# Patient Record
Sex: Male | Born: 1985
Health system: Southern US, Community
[De-identification: ages and names within clinical notes are randomized; demographics above are authoritative.]

## PROBLEM LIST (undated history)

## (undated) DIAGNOSIS — F419 Anxiety disorder, unspecified: Secondary | ICD-10-CM

## (undated) HISTORY — PX: ANTERIOR CRUCIATE LIGAMENT REPAIR: SHX115

---
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
## 2005-03-24 ENCOUNTER — Emergency Department (HOSPITAL_COMMUNITY): Admission: EM | Admit: 2005-03-24 | Discharge: 2005-03-24 | Payer: Self-pay | Admitting: Emergency Medicine

## 2007-06-17 ENCOUNTER — Emergency Department (HOSPITAL_COMMUNITY): Admission: EM | Admit: 2007-06-17 | Discharge: 2007-06-17 | Payer: Self-pay | Admitting: Emergency Medicine

## 2013-06-29 ENCOUNTER — Other Ambulatory Visit: Payer: Self-pay | Admitting: Orthopaedic Surgery

## 2013-06-29 DIAGNOSIS — M25562 Pain in left knee: Secondary | ICD-10-CM

## 2013-07-01 ENCOUNTER — Ambulatory Visit
Admission: RE | Admit: 2013-07-01 | Discharge: 2013-07-01 | Disposition: A | Discharge status: Home / Self Care | Payer: Federal, State, Local not specified - PPO | Source: Ambulatory Visit | Attending: Orthopaedic Surgery | Admitting: Orthopaedic Surgery

## 2013-07-01 DIAGNOSIS — M25562 Pain in left knee: Secondary | ICD-10-CM

## 2013-07-05 ENCOUNTER — Other Ambulatory Visit: Payer: Self-pay

## 2014-01-26 ENCOUNTER — Emergency Department (HOSPITAL_COMMUNITY): Payer: Federal, State, Local not specified - PPO

## 2014-01-26 ENCOUNTER — Emergency Department (HOSPITAL_COMMUNITY)
Admission: EM | Admit: 2014-01-26 | Discharge: 2014-01-26 | Disposition: A | Discharge status: Home / Self Care | Payer: Federal, State, Local not specified - PPO | Attending: Emergency Medicine | Admitting: Emergency Medicine

## 2014-01-26 ENCOUNTER — Encounter (HOSPITAL_COMMUNITY): Payer: Self-pay | Admitting: Emergency Medicine

## 2014-01-26 DIAGNOSIS — R1031 Right lower quadrant pain: Secondary | ICD-10-CM | POA: Insufficient documentation

## 2014-01-26 DIAGNOSIS — R110 Nausea: Secondary | ICD-10-CM | POA: Insufficient documentation

## 2014-01-26 DIAGNOSIS — R1033 Periumbilical pain: Secondary | ICD-10-CM | POA: Insufficient documentation

## 2014-01-26 LAB — CBC WITH DIFFERENTIAL/PLATELET
BASOS ABS: 0 10*3/uL (ref 0.0–0.1)
Basophils Relative: 0 % (ref 0–1)
EOS ABS: 0.4 10*3/uL (ref 0.0–0.7)
Eosinophils Relative: 4 % (ref 0–5)
HCT: 41.1 % (ref 39.0–52.0)
Hemoglobin: 14.2 g/dL (ref 13.0–17.0)
Lymphocytes Relative: 24 % (ref 12–46)
Lymphs Abs: 1.9 10*3/uL (ref 0.7–4.0)
MCH: 31.3 pg (ref 26.0–34.0)
MCHC: 34.5 g/dL (ref 30.0–36.0)
MCV: 90.5 fL (ref 78.0–100.0)
Monocytes Absolute: 0.5 10*3/uL (ref 0.1–1.0)
Monocytes Relative: 6 % (ref 3–12)
Neutro Abs: 5.2 10*3/uL (ref 1.7–7.7)
Neutrophils Relative %: 66 % (ref 43–77)
PLATELETS: 233 10*3/uL (ref 150–400)
RBC: 4.54 MIL/uL (ref 4.22–5.81)
RDW: 12.3 % (ref 11.5–15.5)
WBC: 8 10*3/uL (ref 4.0–10.5)

## 2014-01-26 LAB — COMPREHENSIVE METABOLIC PANEL
ALBUMIN: 4.3 g/dL (ref 3.5–5.2)
ALK PHOS: 54 U/L (ref 39–117)
ALT: 22 U/L (ref 0–53)
AST: 23 U/L (ref 0–37)
BUN: 12 mg/dL (ref 6–23)
CO2: 28 mEq/L (ref 19–32)
Calcium: 9.5 mg/dL (ref 8.4–10.5)
Chloride: 101 mEq/L (ref 96–112)
Creatinine, Ser: 1.04 mg/dL (ref 0.50–1.35)
GFR calc Af Amer: >90 mL/min (ref >90)
GFR calc non Af Amer: >90 mL/min (ref >90)
Glucose, Bld: 90 mg/dL (ref 70–99)
POTASSIUM: 4.3 meq/L (ref 3.7–5.3)
SODIUM: 141 meq/L (ref 137–147)
TOTAL PROTEIN: 7.4 g/dL (ref 6.0–8.3)
Total Bilirubin: 1.2 mg/dL (ref 0.3–1.2)

## 2014-01-26 LAB — LIPASE, BLOOD: Lipase: 30 U/L (ref 11–59)

## 2014-01-26 MED ORDER — MORPHINE SULFATE 4 MG/ML IJ SOLN: 4.0000 mg | Freq: Once | INTRAMUSCULAR | Status: DC

## 2014-01-26 MED ORDER — SODIUM CHLORIDE 0.9 % IV BOLUS (SEPSIS)
1000.0000 mL | Freq: Once | INTRAVENOUS | Status: AC
  Administered 2014-01-26: 1000 mL via INTRAVENOUS

## 2014-01-26 MED ORDER — ONDANSETRON 4 MG PO TBDP: ORAL_TABLET | ORAL | Status: DC

## 2014-01-26 MED ORDER — KETOROLAC TROMETHAMINE 30 MG/ML IJ SOLN
30.0000 mg | Freq: Once | INTRAMUSCULAR | Status: AC
  Administered 2014-01-26: 30 mg via INTRAVENOUS

## 2014-01-26 MED ORDER — MORPHINE SULFATE 4 MG/ML IJ SOLN
4.0000 mg | Freq: Once | INTRAMUSCULAR | Status: AC
  Administered 2014-01-26: 4 mg via INTRAVENOUS

## 2014-01-26 MED ORDER — IOHEXOL 300 MG/ML  SOLN
100.0000 mL | Freq: Once | INTRAMUSCULAR | Status: AC | PRN
  Administered 2014-01-26: 100 mL via INTRAVENOUS

## 2014-01-26 NOTE — ED Notes (Signed)
Pt ambulating independently w/ steady gait on d/c in no acute distress, A&Ox4. D/c instructions reviewed w/ pt and family - pt and family deny any further questions or concerns at present. Rx given x1  

## 2014-01-26 NOTE — ED Notes (Signed)
ABdominal  Pain located rt. Side near the  Umbilicus Began yesterday. Pt. Having nausea. Denies any vomiting or fevers

## 2014-01-26 NOTE — ED Provider Notes (Signed)
CSN: [id number]     Arrival date & time 01/26/14  1700 History   First MD Initiated Contact with Patient 01/26/14 1711     Chief Complaint  Patient presents with  . Abdominal Pain     (Consider location/radiation/quality/duration/timing/severity/associated sxs/prior Treatment) Patient is a 28 y.o. male presenting with abdominal pain. The history is provided by the patient.  Abdominal Pain Pain location:  Periumbilical and RLQ Pain radiates to:  Does not radiate Pain severity:  Moderate Onset quality:  Gradual Timing:  Constant Progression:  Unchanged Chronicity:  New Relieved by:  Nothing Worsened by:  Movement and palpation Ineffective treatments:  None tried Associated symptoms: nausea   Associated symptoms: no chest pain, no chills, no constipation, no cough, no diarrhea, no dysuria, no fever, no shortness of breath and no vomiting     28 yo male pw RLQ abd pain. Onset noon yesterday. Nausea. No emesis. Non-radiating. Not had previously. Normal BM today. Normal uop. No trauma. Ate fast food day before symptoms started.  Went to urgent care today. Had blood work that had finding concerning for appendicitis (presumed leukocytosis) and urinalysis concerning for dehydration (high sp gravity, no blood). Sent to outpt imaging for CT scan. Arrived after hours. Directed to ED for further mgmt.  No CP or SOB.      History reviewed. No pertinent past medical history. History reviewed. No pertinent past surgical history. No family history on file. History  Substance Use Topics  . Smoking status: Never Smoker   . Smokeless tobacco: Not on file  . Alcohol Use: Yes     Comment: occassiona    Review of Systems  Constitutional: Negative for fever and chills.  HENT: Negative for congestion and rhinorrhea.   Respiratory: Negative for cough and shortness of breath.   Cardiovascular: Negative for chest pain.  Gastrointestinal: Positive for nausea and abdominal pain. Negative for  vomiting, diarrhea and constipation.  Genitourinary: Negative for dysuria, flank pain, difficulty urinating, penile pain and testicular pain (although states he had some mild radiation to groin yesterday, but states it was minimal and none currently.).  Neurological: Negative for headaches.  All other systems reviewed and are negative.     Allergies  Review of patient's allergies indicates no known allergies.  Home Medications   Prior to Admission medications   Medication Sig Start Date End Date Taking? Authorizing Provider  ondansetron (ZOFRAN ODT) 4 MG disintegrating tablet 4mg  ODT q6 hours prn nausea/vomit 01/26/14   Stevie Kernyan Ugochi Henzler, MD   BP 129/60  Pulse 82  Temp(Src) 97.8 F (36.6 C) (Oral)  Resp 18  Ht 6\' 3"  (1.905 m)  Wt 197 lb (89.359 kg)  BMI 24.62 kg/m2  SpO2 99% Physical Exam  Nursing note and vitals reviewed. Constitutional: He is oriented to person, place, and time. He appears well-developed and well-nourished. No distress.  Ambulatory. Sitting up in bed. Speaks in full sentences.  HENT:  Head: Normocephalic and atraumatic.  Eyes: Conjunctivae are normal. Right eye exhibits no discharge. Left eye exhibits no discharge.  Cardiovascular: Normal rate, regular rhythm, normal heart sounds and intact distal pulses.   Pulmonary/Chest: Effort normal and breath sounds normal. No respiratory distress. He has no wheezes. He has no rales.  Abdominal: Soft. He exhibits no distension. There is tenderness (moderate periumbilical/suprapubic/RLQ). There is no guarding.  Musculoskeletal: He exhibits no tenderness.  Neurological: He is alert and oriented to person, place, and time.  Skin: Skin is warm and dry.  Psychiatric: He has a normal  mood and affect. His behavior is normal.  declined GU exam  ED Course  Procedures (including critical care time) Labs Review Labs Reviewed  CBC WITH DIFFERENTIAL  COMPREHENSIVE METABOLIC PANEL  LIPASE, BLOOD    Imaging Review Ct Abdomen  Pelvis W Contrast  01/26/2014   CLINICAL DATA:  Right lower quadrant abdominal pain, nausea  EXAM: CT ABDOMEN AND PELVIS WITH CONTRAST  TECHNIQUE: Multidetector CT imaging of the abdomen and pelvis was performed using the standard protocol following bolus administration of intravenous contrast.  CONTRAST:  100mL OMNIPAQUE IOHEXOL 300 MG/ML  SOLN  COMPARISON:  None.  FINDINGS: Normal hepatic contour. There is a minimal amount of focal fatty infiltration adjacent to the fissure for ligamentum teres. No discrete hepatic lesions. Normal appearance of the gallbladder given degree distention. No definite radiopaque gallstones. No intra or extrahepatic biliary duct dilatation. No ascites.  There is symmetric enhancement of the bilateral kidneys. No definite renal stones on this postcontrast examination. Subcentimeter hypo attenuating lesion within in the inferior pole the left kidney (image 37, series 2) is too small to adequately characterize of favored to represent renal cysts. No urinary obstruction or perinephric stranding. Normal appearance of the bilateral adrenal glands, pancreas and spleen.  Ingested enteric contrast extends to the level of the mid small bowel. The bowel is normal in course and caliber without wall thickening or evidence of obstruction. Normal appearance of the retrocecal appendix. No pneumoperitoneum, pneumatosis or portal venous gas.  Normal caliber the abdominal aorta. The major branch vessels of the abdominal aorta appear widely patent on this non CTA examination. No retroperitoneal, mesenteric, pelvic or inguinal lymphadenopathy. Normal appearance of the pelvic organs. No free fluid within the lower pelvis.  Limited visualization the lower thorax is negative for focal airspace opacity or pleural effusion. Normal heart size. No pericardial effusion.  No acute or aggressive osseus abnormalities. Regional soft tissues appear normal.  IMPRESSION: No explanation for patient's right lower quadrant  abdominal pain and nausea nausea. Specifically, no evidence of enteric or urinary obstruction. Normal appearance of the appendix.   Electronically Signed   By: Simonne ComeJohn  Watts M.D.   On: 01/26/2014 18:15     EKG Interpretation None      MDM   Final diagnoses:  Right lower quadrant abdominal pain  Nausea    RLQ pain. Concern for possibly Appy. Labs and imaging pending.  Results as above. Reassuring.  Repeat exam improved from prior. Without GU symptoms to suggest torsion or other emergent pathology.  No hematuria and symptoms not cw nephrolithiasis.  CT scan negative for emergent pathology.  Patient discharged home. Return precautions given. To follow up with pcp. Patient and wife in agreement with plan.  Labs and imaging reviewed by myself and considered in medical decision making if ordered. Imaging interpreted by radiology.   Discussed case with Dr. Preston FleetingGlick who is in agreement with assessment and plan.       Stevie Kernyan Taje Tondreau, MD 01/27/14 [phone]

## 2014-01-26 NOTE — ED Notes (Signed)
MD at bedside. 

## 2014-01-26 NOTE — ED Provider Notes (Signed)
28 year old male comes in with a one-day history of right lower quadrant pain. There is mild nausea. No fever or chills. He was seen in urgent care Center where there was concern for appendicitis but outpatient radiology was not available and he was sent here. On exam, he is well localized tenderness in the right lower quadrant with rebound tenderness but no guarding. Bowel sounds are decreased. CT has been ordered.  I saw and evaluated the patient, reviewed the resident's note and I agree with the findings and plan.    Dione Boozeavid Shanya Ferriss, MD 01/26/14 [phone]

## 2014-01-26 NOTE — Discharge Instructions (Signed)
Abdominal Pain °Many things can cause abdominal pain. Usually, abdominal pain is not caused by a disease and will improve without treatment. It can often be observed and treated at home. Your health care provider will do a physical exam and possibly order blood tests and X-rays to help determine the seriousness of your pain. However, in many cases, more time must pass before a clear cause of the pain can be found. Before that point, your health care provider may not know if you need more testing or further treatment. °HOME CARE INSTRUCTIONS  °Monitor your abdominal pain for any changes. The following actions may help to alleviate any discomfort you are experiencing: °· Only take over-the-counter or prescription medicines as directed by your health care provider. °· Do not take laxatives unless directed to do so by your health care provider. °· Try a clear liquid diet (broth, tea, or water) as directed by your health care provider. Slowly move to a bland diet as tolerated. °SEEK MEDICAL CARE IF: °· You have unexplained abdominal pain. °· You have abdominal pain associated with nausea or diarrhea. °· You have pain when you urinate or have a bowel movement. °· You experience abdominal pain that wakes you in the night. °· You have abdominal pain that is worsened or improved by eating food. °· You have abdominal pain that is worsened with eating fatty foods. °· You have a fever. °SEEK IMMEDIATE MEDICAL CARE IF:  °· Your pain does not go away within 2 hours. °· You keep throwing up (vomiting). °· Your pain is felt only in portions of the abdomen, such as the right side or the left lower portion of the abdomen. °· You pass bloody or black tarry stools. °MAKE SURE YOU: °· Understand these instructions.   °· Will watch your condition.   °· Will get help right away if you are not doing well or get worse.   °Document Released: 05/02/2005 Document Revised: 07/28/2013 Document Reviewed: 04/01/2013 °ExitCare® Patient Information  ©2015 [employer]. This information is not intended to replace advice given to you by your health care provider. Make sure you discuss any questions you have with your health care provider. ° °Nausea and Vomiting °Nausea is a sick feeling that often comes before throwing up (vomiting). Vomiting is a reflex where stomach contents come out of your mouth. Vomiting can cause severe loss of body fluids (dehydration). Children and elderly adults can become dehydrated quickly, especially if they also have diarrhea. Nausea and vomiting are symptoms of a condition or disease. It is important to find the cause of your symptoms. °CAUSES  °· Direct irritation of the stomach lining. This irritation can result from increased acid production (gastroesophageal reflux disease), infection, food poisoning, taking certain medicines (such as nonsteroidal anti-inflammatory drugs), alcohol use, or tobacco use. °· Signals from the brain. These signals could be caused by a headache, heat exposure, an inner ear disturbance, increased pressure in the brain from injury, infection, a tumor, or a concussion, pain, emotional stimulus, or metabolic problems. °· An obstruction in the gastrointestinal tract (bowel obstruction). °· Illnesses such as diabetes, hepatitis, gallbladder problems, appendicitis, kidney problems, cancer, sepsis, atypical symptoms of a heart attack, or eating disorders. °· Medical treatments such as chemotherapy and radiation. °· Receiving medicine that makes you sleep (general anesthetic) during surgery. °DIAGNOSIS °Your caregiver may ask for tests to be done if the problems do not improve after a few days. Tests may also be done if symptoms are severe or if the reason for the nausea   and vomiting is not clear. Tests may include: °· Urine tests. °· Blood tests. °· Stool tests. °· Cultures (to look for evidence of infection). °· X-rays or other imaging studies. °Test results can help your caregiver make decisions about  treatment or the need for additional tests. °TREATMENT °You need to stay well hydrated. Drink frequently but in small amounts. You may wish to drink water, sports drinks, clear broth, or eat frozen ice pops or gelatin dessert to help stay hydrated. When you eat, eating slowly may help prevent nausea. There are also some antinausea medicines that may help prevent nausea. °HOME CARE INSTRUCTIONS  °· Take all medicine as directed by your caregiver. °· If you do not have an appetite, do not force yourself to eat. However, you must continue to drink fluids. °· If you have an appetite, eat a normal diet unless your caregiver tells you differently. °¨ Eat a variety of complex carbohydrates (rice, wheat, potatoes, bread), lean meats, yogurt, fruits, and vegetables. °¨ Avoid high-fat foods because they are more difficult to digest. °· Drink enough water and fluids to keep your urine clear or pale yellow. °· If you are dehydrated, ask your caregiver for specific rehydration instructions. Signs of dehydration may include: °¨ Severe thirst. °¨ Dry lips and mouth. °¨ Dizziness. °¨ Dark urine. °¨ Decreasing urine frequency and amount. °¨ Confusion. °¨ Rapid breathing or pulse. °SEEK IMMEDIATE MEDICAL CARE IF:  °· You have blood or brown flecks (like coffee grounds) in your vomit. °· You have black or bloody stools. °· You have a severe headache or stiff neck. °· You are confused. °· You have severe abdominal pain. °· You have chest pain or trouble breathing. °· You do not urinate at least once every 8 hours. °· You develop cold or clammy skin. °· You continue to vomit for longer than 24 to 48 hours. °· You have a fever. °MAKE SURE YOU:  °· Understand these instructions. °· Will watch your condition. °· Will get help right away if you are not doing well or get worse. °Document Released: 07/23/2005 Document Revised: 10/15/2011 Document Reviewed: 12/20/2010 °ExitCare® Patient Information ©2015 [employer]. This information is not  intended to replace advice given to you by your health care provider. Make sure you discuss any questions you have with your health care provider. ° °

## 2014-11-09 ENCOUNTER — Ambulatory Visit (INDEPENDENT_AMBULATORY_CARE_PROVIDER_SITE_OTHER): Payer: Federal, State, Local not specified - PPO | Admitting: Psychiatry

## 2014-11-09 ENCOUNTER — Encounter (HOSPITAL_COMMUNITY): Payer: Self-pay

## 2014-11-09 ENCOUNTER — Encounter (INDEPENDENT_AMBULATORY_CARE_PROVIDER_SITE_OTHER): Payer: Self-pay

## 2014-11-09 ENCOUNTER — Encounter (HOSPITAL_COMMUNITY): Payer: Self-pay | Admitting: Psychiatry

## 2014-11-09 VITALS — BP 119/81 | HR 65 | Ht 75.0 in | Wt 210.6 lb

## 2014-11-09 DIAGNOSIS — F4323 Adjustment disorder with mixed anxiety and depressed mood: Secondary | ICD-10-CM

## 2014-11-09 MED ORDER — DIVALPROEX SODIUM ER 250 MG PO TB24: ORAL_TABLET | ORAL | Status: DC
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
## 2014-11-16 ENCOUNTER — Ambulatory Visit (HOSPITAL_COMMUNITY): Payer: Federal, State, Local not specified - PPO | Admitting: Clinical

## 2014-11-23 ENCOUNTER — Ambulatory Visit (HOSPITAL_COMMUNITY): Payer: Self-pay | Admitting: Clinical

## 2014-11-24 ENCOUNTER — Encounter (HOSPITAL_COMMUNITY): Payer: Self-pay | Admitting: Clinical

## 2014-11-24 ENCOUNTER — Ambulatory Visit (INDEPENDENT_AMBULATORY_CARE_PROVIDER_SITE_OTHER): Payer: Federal, State, Local not specified - PPO | Admitting: Clinical

## 2014-11-24 ENCOUNTER — Encounter (HOSPITAL_COMMUNITY): Payer: Self-pay

## 2014-11-24 DIAGNOSIS — F4323 Adjustment disorder with mixed anxiety and depressed mood: Secondary | ICD-10-CM
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
## 2014-12-02 ENCOUNTER — Ambulatory Visit (HOSPITAL_COMMUNITY): Payer: Federal, State, Local not specified - PPO | Admitting: Psychiatry

## 2014-12-09 ENCOUNTER — Ambulatory Visit (HOSPITAL_COMMUNITY): Payer: Self-pay | Admitting: Psychiatry

## 2014-12-14 ENCOUNTER — Ambulatory Visit (INDEPENDENT_AMBULATORY_CARE_PROVIDER_SITE_OTHER): Payer: Federal, State, Local not specified - PPO | Admitting: Psychiatry

## 2014-12-14 ENCOUNTER — Encounter (HOSPITAL_COMMUNITY): Payer: Self-pay | Admitting: Psychiatry

## 2014-12-14 VITALS — BP 119/61 | HR 71 | Ht 75.0 in | Wt 220.6 lb

## 2014-12-14 DIAGNOSIS — F39 Unspecified mood [affective] disorder: Secondary | ICD-10-CM | POA: Diagnosis not present

## 2014-12-14 DIAGNOSIS — F1010 Alcohol abuse, uncomplicated: Secondary | ICD-10-CM | POA: Diagnosis not present

## 2014-12-14 DIAGNOSIS — F4323 Adjustment disorder with mixed anxiety and depressed mood: Secondary | ICD-10-CM | POA: Diagnosis not present

## 2014-12-14 MED ORDER — DIVALPROEX SODIUM ER 500 MG PO TB24: 500.0000 mg | ORAL_TABLET | Freq: Every day | ORAL | Status: DC

## 2014-12-14 NOTE — Progress Notes (Signed)
[facility] [id number] Progress Note  Kevin GlimpseJohn D Hebert [id number] 29 y.o.  12/14/2014 10:46 AM  Chief Complaint:  I am feeling better with the medication.    History of Present Illness:  Kevin RuizJohn came for his follow-up appointment.  He's a 29 year old African-American man who was seen first time on April 5 as initial evaluation.  He was referred from his primary care physician because he was feeling sad depressed and irritable.  We started him on Depakote.  He is tolerating his medication and denies any side effects.  He still feels some time irritable, angry, discouraged but he is less depressed from the past.  He has been not involved in any more incident with a male coworker.  However he is very stressed about his ongoing investigation process.  He saw therapist Scarlette CalicoFrances and he liked and he wants to continue it.  He admitted cut down his drinking but he is still drinks to 3 times a week.  He denies any binge drinking.  He denies any illegal substance use.  He sleeping 5-6 hours but sometime he is restless.  His flashbacks and nightmares are less intense from the past.  Patient is trying to get permanent job and he is very concerned that recent incident may cause problem.  He has noticed to 3 times working because of not feeling well.  His energy level is same.  His appetite is increased and he has gained weight from the past.  He is not involved in any self abusive behavior, aggression, violence.  He denies any side effects including any tremors or shakes.  Patient like to have his records sent to work[employer] since he had missed a few days.  He admitted stress sometime causing headaches and he supposed to give us the medication name that he is taking for migraine headaches but he for To bring it today.  Patient lives with his wife and together they have 30-year-old daughter.  Patient is working at a Research officer, political [employer].  Suicidal Ideation: No Plan Formed: No Patient has means to carry out plan:  No  Homicidal Ideation: No Plan Formed: No Patient has means to carry out plan: No  Past Psychiatric History/Hospitalization(s) Patient denies any previous history of psychiatric treatment, suicidal attempt or any aggressive behavior.   Anxiety: Yes Bipolar Disorder: No Depression: Yes Mania: No Psychosis: No Schizophrenia: No Personality Disorder: No Hospitalization for psychiatric illness: No History of Electroconvulsive Shock Therapy: No Prior Suicide Attempts: No  Medical History; Patient endorse headaches.  He has a history of torn anterior cruciate ligament.  His primary care physician is Dr. Suzzette RighterGeorge Ose Bonsu.   Review of Systems  Constitutional: Positive for malaise/fatigue. Negative for weight loss and diaphoresis.  Eyes: Negative for blurred vision.  Respiratory: Negative for cough.   Cardiovascular: Negative for chest pain and palpitations.  Gastrointestinal: Negative for nausea and abdominal pain.  Musculoskeletal: Negative.   Skin: Negative for itching and rash.  Neurological: Positive for headaches. Negative for tingling and tremors.    Psychiatric: Agitation: Irritability Hallucination: No Depressed Mood: Yes Insomnia: Yes Hypersomnia: No Altered Concentration: No Feels Worthless: No Grandiose Ideas: No Belief In Special Powers: No New/Increased Substance Abuse: Yes Compulsions: No  Neurologic: Headache: Yes Seizure: No Paresthesias: No   Musculoskeletal: Strength & Muscle Tone: within normal limits Gait & Station: normal Patient leans: N/A   Outpatient Encounter Prescriptions as of 12/14/2014  Medication Sig  . [DISCONTINUED] divalproex (DEPAKOTE ER) 250 MG 24 hr tablet Take 1 tab  daily at bed time for 1 week and than 2 tab at bed time  . divalproex (DEPAKOTE ER) 500 MG 24 hr tablet Take 1 tablet (500 mg total) by mouth at bedtime.   No facility-administered encounter medications on file as of 12/14/2014.    No results found for this or any  previous visit (from the past 2160 hour(s)).    Constitutional:  BP 119/61 mmHg  Pulse 71  Ht 6\' 3"  (1.905 m)  Wt 220 lb 9.6 oz (100.064 kg)  BMI 27.57 kg/m2   Mental Status Examination;  Patient is a young man who is well-built and appears to be in his stated age.  He is anxious but cooperative.  He described his mood tired and his affect is constricted. He denies any auditory or visual hallucination.  He denies any active or passive suicidal thoughts or homicidal thought.  There were no paranoia, delusion or any obsessive thoughts.  His psychomotor activity is slow.  His fund of knowledge is adequate.  His attention and concentration is fair.  His speech is slow but fluent and coherent.  There were no flight of ideas or any loose association.  His cognition is intact.  He is alert and oriented 3.  His insight judgment and impulse control is okay.   Established Problem, Stable/Improving (1), Review of Psycho-Social Stressors (1), Review and summation of old records (2), Review of Last Therapy Session (1), Review of Medication Regimen & Side Effects (2) and Review of New Medication or Change in Dosage (2)  Assessment: Axis I: Adjustment disorder with anxiety and depressed mood.  Episodic mood disorder , alcohol abuse   Axis II: Deferred  Axis III:  History reviewed. No pertinent past medical history.   Plan:  Patient is slowly getting better.  He is still has lot of irritability anger and anxiety mood.  He still have migraine headaches .  We talked about stopping alcohol .  Patient supposed to bring his list of medication but he forgot.  I recommended to increase Depakote 500 mg at bedtime at this time he has gained weight but otherwise no major side effects.  Encouraged to watch his calorie intake and to regular exercise to avoid further weight gain.  Encouraged to keep appointment with Scarlette CalicoFrances for coping and social skills.  Patient like to send the records to [employer] as patient  had missed a few days from work.  We are still awaiting collateral information from his primary care physician.  We will consider blood work Depakote level on his next appointment.  Time spent 25 which includes talking about medication side effects, benefits, prognosis of his illness, safety concerns, weight maintenance and sleep hygiene and regular follow-up with primary care physician.  We also talked about short-term and long-term medication side effects and interaction of alcohol with her psychotropic medication and causing delayed recovery office psychiatric illness.  Encouraged to call us back if he has any question, concern or if he feel worsening of the symptoms.  I will see him again in 4 weeks.   Aldena Worm T., MD 12/14/2014

## 2014-12-15 ENCOUNTER — Ambulatory Visit (HOSPITAL_COMMUNITY): Payer: Self-pay | Admitting: Clinical

## 2014-12-16 ENCOUNTER — Ambulatory Visit (HOSPITAL_COMMUNITY): Payer: Self-pay | Admitting: Psychiatry

## 2014-12-28 ENCOUNTER — Telehealth (HOSPITAL_COMMUNITY): Payer: Self-pay

## 2014-12-28 NOTE — Telephone Encounter (Signed)
Telephone call with patient to follow up on forms due to his work for [employer].  Informed Dr. Lolly MustacheArfeen was out some over the past week and would call to see if could still be accepted.  Questioned if patient was missing work now as he stated sometimes he is missing a day "here and there".  Question if there were other forms with the ones needed as appears to be a page missing.  Patient reported that was something he had to fill out and return but Dr. Lolly MustacheArfeen just needed to answer the questions and send in what the form is requesting.  Agreed to call Serita GritSeama Kordbacheh to request an extension on the forms and then left her a message at [phone] to request now that Dr. Lolly MustacheArfeen back in the office could this be turned in by 12/29/14?  Requested a call back from collateral on message.

## 2014-12-29 NOTE — Telephone Encounter (Signed)
Telephone call to inform patient the needed forms for his work had been sent to his employer. Letter completed by Dr. Lolly MustacheArfeen and requested record information.  Items scanned into [employer].

## 2015-01-10 ENCOUNTER — Ambulatory Visit (HOSPITAL_COMMUNITY): Payer: Self-pay | Admitting: Clinical

## 2015-01-17 ENCOUNTER — Ambulatory Visit (HOSPITAL_COMMUNITY): Payer: Self-pay | Admitting: Psychiatry

## 2015-01-31 ENCOUNTER — Ambulatory Visit (HOSPITAL_COMMUNITY): Payer: Self-pay | Admitting: Psychiatry

## 2015-02-14 ENCOUNTER — Ambulatory Visit (HOSPITAL_COMMUNITY): Payer: Self-pay | Admitting: Clinical

## 2015-08-09 ENCOUNTER — Emergency Department (HOSPITAL_COMMUNITY)
Admission: EM | Admit: 2015-08-09 | Discharge: 2015-08-09 | Disposition: A | Discharge status: Home / Self Care | Payer: Federal, State, Local not specified - PPO | Attending: Emergency Medicine | Admitting: Emergency Medicine

## 2015-08-09 ENCOUNTER — Emergency Department (HOSPITAL_COMMUNITY): Payer: Federal, State, Local not specified - PPO

## 2015-08-09 ENCOUNTER — Encounter (HOSPITAL_COMMUNITY): Payer: Self-pay | Admitting: Emergency Medicine

## 2015-08-09 DIAGNOSIS — J219 Acute bronchiolitis, unspecified: Secondary | ICD-10-CM | POA: Diagnosis not present

## 2015-08-09 DIAGNOSIS — Z79899 Other long term (current) drug therapy: Secondary | ICD-10-CM | POA: Diagnosis not present

## 2015-08-09 DIAGNOSIS — R05 Cough: Secondary | ICD-10-CM | POA: Diagnosis present

## 2015-08-09 MED ORDER — AZITHROMYCIN 250 MG PO TABS: ORAL_TABLET | ORAL | Status: DC

## 2015-08-09 MED ORDER — PREDNISONE 50 MG PO TABS: ORAL_TABLET | ORAL | Status: DC

## 2015-08-09 NOTE — ED Notes (Signed)
Pt states his daughter was diagnosed with pneumonia x 1 month ago. States he began around that time with a productive cough, nasal congestion and runny nose. Lung sounds clear, did not take anything before coming here.

## 2015-08-09 NOTE — Discharge Instructions (Signed)
For pain control please take ibuprofen (also known as Motrin or Advil) 800mg  (this is normally 4 over the counter pills) 3 times a day  for 5 days. Take with food to minimize stomach irritation.  Use nasal saline (you can try Arm and Hammer Simply Saline) at least 4 times a day, use saline 5-10 minutes before using the fluticasone (flonase) nasal spray  Do not use Afrin (Oxymetazoline)  Rest, wash hands frequently  and drink plenty of water.  You may try counter medication such as Mucinex or Sudafed or Claritin decongestant.  Do not hesitate to return to the emergency room for any new, worsening or concerning symptoms.  Please obtain primary care using resource guide below. Let them know that you were seen in the emergency room and that they will need to obtain records for further outpatient management.     Emergency Department Resource Guide 1) Find a Doctor and Pay Out of Pocket Although you won't have to find out who is covered by your insurance plan, it is a good idea to ask around and get recommendations. You will then need to call the office and see if the doctor you have chosen will accept you as a new patient and what types of options they offer for patients who are self-pay. Some doctors offer discounts or will set up payment plans for their patients who do not have insurance, but you will need to ask so you aren't surprised when you get to your appointment.  2) Contact Your Local Health Department Not all health departments have doctors that can see patients for sick visits, but many do, so it is worth a call to see if yours does. If you don't know where your local health department is, you can check in your phone book. The CDC also has a tool to help you locate your state's health department, and many state websites also have listings of all of their local health departments.  3) Find a Walk-in Clinic If your illness is not likely to be very severe or complicated, you may want to  try a walk in clinic. These are popping up all over the country in pharmacies, drugstores, and shopping centers. They're usually staffed by nurse practitioners or physician assistants that have been trained to treat common illnesses and complaints. They're usually fairly quick and inexpensive. However, if you have serious medical issues or chronic medical problems, these are probably not your best option.  No Primary Care Doctor: - Call Health Connect at  [phone] - they can help you locate a primary care doctor that  accepts your insurance, provides certain services, etc. - Physician Referral Service- [phone]  Chronic Pain Problems: Organization         Address  Phone   Notes  [facility]  [phone] Patients need to be referred by their primary care doctor.   Medication Assistance: Organization         Address  Phone   Notes  [facility] Medication [facility] [address] [place] [place], [place] [address] [phone] --Must be a resident of [facility] -- Must have NO insurance coverage whatsoever (no Medicaid/ Medicare, etc.) -- The pt. MUST have a primary care doctor that directs their care regularly and follows them in the community   [facility]  [phone]   [employer]  [phone]    Agencies that provide inexpensive medical care: Organization         Address  Phone   Notes  [facility]  [phone]   [facility]    [phone]   [facility] [address] [place], [place] [address] [phone]   [facility] [address], [place] [phone]   Planned Parenthood    [phone]   [facility]    [phone]   [facility]  [address], [place] Phone:  [phone], Fax:  [phone] Hours of Operation:  9 am - 6 pm, M-F.  Also accepts Medicaid/Medicare and self-pay.  [facility]  [address], [place] Phone: [phone], Fax: [phone]. Hours of Operation:  8:30 am - 5:30 pm, M-F.  Also accepts Medicaid and self-pay.  [facility] [place] [address], [place] Phone: [phone]   [facility] [address] Natasha Bence [place], [place] [phone], Ext. [phone] Mondays & Thursdays: 7-9 AM.  First 15 patients are seen on a first come, first serve basis.    Medicaid-accepting [facility] Providers:  Organization         Address  Phone   Notes  [facility] [address], [place] [phone] Also accepts self-pay patients.  [facility] [address] Laurell Josephs [place], [place]  [phone]   [facility] [address], [place] [phone]   [facility] [address], [place] [phone]   Renaye Rakers [address], [place]   [phone] Only accepts [place] Access [place] patients after they have their name applied to their card.   Self-Pay (no insurance) in [facility]:  Organization         Address  Phone   Notes  Sickle Cell Patients, [facility] [address] [place], [place] [phone]   [facility] [address] [place], [place] [phone]   [facility] [place]  [address],  [phone]   [facility]/Dr. Osei-Bonsu  [address] [place] or [address], [place] [phone] Phone number for both [place] and [place] locations is the same.  [facility] [address], [place] [phone]   [facility] [address] [place] or [address] [phone]   [facility] [address] [place] [phone], phone; [phone], fax  Sees patients 1st and 3rd Saturday of every month.  Must not qualify for public or private insurance (i.e. Medicaid, Medicare, [place] Health Choice, Veterans' Benefits)  Household income should be no more than 200% of the poverty level The clinic cannot treat you if you are pregnant or think you are pregnant  Sexually transmitted diseases are not treated at the clinic.    Dental Care: Organization         Address  Phone  Notes  [facility] [address] [place], [place] [phone] Accepts children up to age 41 who are enrolled in [place] or [place] Health Choice; pregnant women with a Medicaid card; and children who have applied for Medicaid or [place] Health Choice, but were declined, whose parents can pay a reduced fee at time of service.  [facility] [place]  [address], [place] [phone]) [phone] Accepts children up to age 31 who are enrolled in [place] or [place] Health Choice; pregnant women with a Medicaid card; and children who have applied for Medicaid or [place] Health Choice, but were declined, whose parents can pay a reduced fee at time of service.  [facility]  [address], [place] [phone]) [phone] Patients are seen by appointment only. Walk-ins are not accepted. [facility] will see patients 31 years of age and older. Monday - Tuesday (8am-5pm) Most Wednesdays (8:30-5pm) $30 per visit, cash only  [facility]  [address], [facility] [phone]) [phone] Patients are seen by appointment only. Walk-ins are not accepted. [facility] will see patients 31 years of age and older. One Wednesday Evening (Monthly: Volunteer Based).  $30 per visit, cash only  [facility]  [phone]) [phone] for adults; Children under age 894, call Graduate Pediatric Dentistry at [phone]. Children aged 704-14, please call [phone]) [phone] to  request a pediatric application.  Dental services are provided in all areas of dental care including fillings, crowns and bridges, complete and partial dentures, implants, gum treatment, root canals, and extractions. Preventive care is also provided. Treatment is provided to both adults and children. Patients are selected via a lottery and there is often a waiting list.   [facility] [address], [place]  [phone]) [phone] [email or web address]   [facility] [address], [place], [place] [phone], Ext. [phone] Second and Fourth Thursday of each month, opens at 6:30 AM; Clinic ends at 9 AM.  Patients are seen on a first-come first-served basis, and a limited number are seen during each clinic.   [facility]  [address], [place], [place] [phone]) [phone]   Eligibility Requirements You must have lived in [place], [place], or [place] counties for at least the last three months.   You cannot be eligible for state or federal sponsored National Cityhealthcare insurance, including CIGNAVeterans Administration, [place]icaid, or [employer]icare.   You generally cannot be eligible for healthcare insurance through your employer.    How to apply: Eligibility screenings are held every Tuesday and Wednesday afternoon from 1:00 pm until 4:00 pm. You do not need an appointment for the interview!  [facility] [address], [place], [place] [phone]   [facility]  [phone]   [facility]  [phone]   [facility]  [phone]    Behavioral Health Resources in the Community: Intensive Outpatient Programs Organization         Address  Phone  Notes  [facility] [address], [place], [place] [phone]   [facility] [address], [place], [place] [phone]   [facility] [address], [place], [place]  [phone]   [facility] [address],  [place], [place] [phone] or [phone]   Substance Abuse Resources Organization         Address  Phone  Notes  [facility]  [phone]   [facility]  [phone]   [facility]  [phone]   [facility]  [phone]   Residential & Outpatient Substance Abuse Program  [phone]   Psychological Services Organization         Address  Phone  Notes  [facility] Behavioral Health  [phone]   [facility]  [phone]   [facility] [address], [place] [phone] or [phone]    Mobile Crisis Teams Organization         Address  Phone  Notes  [facility], Mobile Crisis Care Unit  [phone]   [facility]  [address] [place], [place] [phone]   Doristine Locks [address] [facility] [place] [phone]    Self-Help/Support Groups Organization         Address  Phone             Notes  [facility] [place] - variety of support groups  [phone]- [phone] Call for more information  Narcotics Anonymous (NA), [facility] [address], [employer] [place]  2 meetings at this location   Statistician         Address  Phone  Notes  [facility] [address],    [place]  [phone]   [facility]  [address], [place] [address], [place], [place] [phone]   [facility] [address] [place], [place] [phone] Admissions: 8am-3pm M-F  [facility] [address]    [place], [place] [phone]   [facility] [address] [place], [place], [place] [phone]   [facility] [address]  [place], [place] [phone]   [facility] - Intensive Outpatient [address] Laurell Joseph[address], [place], [place] [phone]   [facility] ([facility] [address] [place]  [place], [place] [phone] or [phone]   [facility] ([facility]) [address] [place], [place] [phone] Accepts Medicaid  [facility] [address],  [place] [phone] Substance Abuse/Addiction Treatment   [facility] Organization         Address  Phone  Notes  [facility]  [phone]   Angie Fava, PhD [address] Ervin Knack [place], [place]   [phone] or [phone]   [facility] Behavioral   [address] [place], [place] [phone]   [facility] [address], [place], [place] [phone] Insurance/Medicaid/sponsorship through [facility] [address]                                    [place], [place] [phone] Therapy/tele-psych/case  [facility] [address], [place] [phone]    Dr. Lolly Mustache  [phone]   [facility]  [employer] [facility] Dept. 1) [address] [place] 2) [address] [place] 3)  [address], [place] [phone]  [phone]   [facility] Child Abuse Hotline [phone]) 362-0247 or [phone] (After Hours)

## 2015-08-09 NOTE — ED Provider Notes (Addendum)
CSN: [id number]     Arrival date & time 08/09/15  1142 History  By signing my name below, I, Essence Howell, attest that this documentation has been prepared under the direction and in the presence of Wynetta EmeryNicole Tiphanie Vo, PA-C Electronically Signed: Charline BillsEssence Howell, ED Scribe 08/09/2015 at 1:04 PM.   Chief Complaint  Patient presents with  . Cough  . Nasal Congestion   The history is provided by the patient. No language interpreter was used.   HPI Comments: Adela GlimpseJohn D Cabiness is a 30 y.o. male who presents to the Emergency Department complaining of gradually improving productive cough for the past month. Pt states that his daughter was diagnosed with pneumonia 1 month ago. He reports associated congestion, rhinorrhea and sinus pressure. Pt denies fever, ear pain, chest pain, SOB, loss of appetite.   History reviewed. No pertinent past medical history. Past Surgical History  Procedure Laterality Date  . Anterior cruciate ligament repair     History reviewed. No pertinent family history. Social History  Substance Use Topics  . Smoking status: Never Smoker   . Smokeless tobacco: Never Used  . Alcohol Use: 0.0 oz/week    0 Standard drinks or equivalent per week     Comment: occassional    Review of Systems  A complete 10 system review of systems was obtained and all systems are negative except as noted in the HPI and PMH.   Allergies  Review of patient's allergies indicates no known allergies.  Home Medications   Prior to Admission medications   Medication Sig Start Date End Date Taking? Authorizing Provider  divalproex (DEPAKOTE ER) 500 MG 24 hr tablet Take 1 tablet (500 mg total) by mouth at bedtime. 12/14/14   Cleotis NipperSyed T Arfeen, MD   BP 144/86 mmHg  Pulse 75  Temp(Src) 98.4 F (36.9 C) (Oral)  Resp 16  SpO2 100% Physical Exam  Constitutional: He appears well-developed and well-nourished.  HENT:  Head: Normocephalic.  Right Ear: External ear normal.  Left Ear: External ear normal.   Mouth/Throat: Oropharynx is clear and moist. No oropharyngeal exudate.  No drooling or stridor. Posterior pharynx mildly erythematous no significant tonsillar hypertrophy. No exudate. Soft palate rises symmetrically. No TTP or induration under tongue.   No tenderness to palpation of frontal or bilateral maxillary sinuses.  Mild mucosal edema in the nares with scant rhinorrhea.  Bilateral tympanic membranes with normal architecture and good light reflex.    Eyes: Conjunctivae and EOM are normal. Pupils are equal, round, and reactive to light.  Neck: Normal range of motion. Neck supple.  Cardiovascular: Normal rate and regular rhythm.   Pulmonary/Chest: Effort normal and breath sounds normal. No stridor. No respiratory distress. He has no wheezes. He has no rales. He exhibits no tenderness.  Abdominal: Soft. There is no tenderness. There is no rebound and no guarding.  Nursing note and vitals reviewed.   ED Course  Procedures (including critical care time) DIAGNOSTIC STUDIES: Oxygen Saturation is 100% on RA, normal by my interpretation.    COORDINATION OF CARE: 12:21 PM-Discussed treatment plan which includes CXR, Prednisone and azithromycin with pt at bedside and pt agreed to plan.   Labs Review Labs Reviewed - No data to display  Imaging Review Dg Chest 2 View  08/09/2015  CLINICAL DATA:  Cough and congestion for 1 month. Occasional smoker. EXAM: CHEST  2 VIEW COMPARISON:  03/24/2005 FINDINGS: The heart size and mediastinal contours are within normal limits. Both lungs are clear. The visualized skeletal structures are  unremarkable. IMPRESSION: No active cardiopulmonary disease. Electronically Signed   By: Charlett Nose M.D.   On: 08/09/2015 12:58   I have personally reviewed and evaluated these images and lab results as part of my medical decision-making.   EKG Interpretation None      MDM   Final diagnoses:  Acute bronchiolitis due to unspecified organism    Filed  Vitals:   08/09/15 1150 08/09/15 1312  BP: 144/86   Pulse: 75 77  Temp: 98.4 F (36.9 C)   TempSrc: Oral   Resp: 16 18  SpO2: 100% 100%     RAEVON BROOM is 30 y.o. male presenting with rhinorrhea and cough onset 1 month ago. NAD, Non-toxic appearing, AFVSS, LSCTA, chest x-ray clear. Given the length of symptoms patient will be started on Z-Pak and prednisone for acute bronchitis. Advised patient to push fluids, over-the-counter medications for symptom relief.  Evaluation does not show pathology that would require ongoing emergent intervention or inpatient treatment. Pt is hemodynamically stable and mentating appropriately. Discussed findings and plan with patient/guardian, who agrees with care plan. All questions answered. Return precautions discussed and outpatient follow up given.   Discharge Medication List as of 08/09/2015  1:03 PM    START taking these medications   Details  azithromycin (ZITHROMAX Z-PAK) 250 MG tablet 2 Tabs PO on day one, then 1 tab PO daily x 4 days, Print    predniSONE (DELTASONE) 50 MG tablet Take 1 tablet daily with breakfast, Print         I personally performed the services described in this documentation, which was scribed in my presence. The recorded information has been reviewed and is accurate.    Wynetta Emery, PA-C 08/09/15 1508  Lyndal Pulley, MD 08/11/15 0908  Wynetta Emery, PA-C 08/24/15 1348  Lyndal Pulley, MD 08/24/15 [phone]

## 2016-11-01 ENCOUNTER — Encounter (HOSPITAL_BASED_OUTPATIENT_CLINIC_OR_DEPARTMENT_OTHER): Payer: Self-pay | Admitting: *Deleted

## 2016-11-01 ENCOUNTER — Emergency Department (HOSPITAL_BASED_OUTPATIENT_CLINIC_OR_DEPARTMENT_OTHER)
Admission: EM | Admit: 2016-11-01 | Discharge: 2016-11-01 | Disposition: A | Discharge status: Home / Self Care | Payer: Federal, State, Local not specified - PPO | Attending: Emergency Medicine | Admitting: Emergency Medicine

## 2016-11-01 ENCOUNTER — Emergency Department (HOSPITAL_BASED_OUTPATIENT_CLINIC_OR_DEPARTMENT_OTHER): Payer: Federal, State, Local not specified - PPO

## 2016-11-01 DIAGNOSIS — J189 Pneumonia, unspecified organism: Secondary | ICD-10-CM | POA: Diagnosis not present

## 2016-11-01 DIAGNOSIS — R0602 Shortness of breath: Secondary | ICD-10-CM | POA: Diagnosis present

## 2016-11-01 MED ORDER — IBUPROFEN 400 MG PO TABS
600.0000 mg | ORAL_TABLET | Freq: Once | ORAL | Status: AC
  Administered 2016-11-01: 600 mg via ORAL
  Filled 2016-11-01: qty 1

## 2016-11-01 MED ORDER — AZITHROMYCIN 250 MG PO TABS
500.0000 mg | ORAL_TABLET | Freq: Once | ORAL | Status: AC
  Administered 2016-11-01: 500 mg via ORAL
  Filled 2016-11-01: qty 2

## 2016-11-01 MED ORDER — AZITHROMYCIN 250 MG PO TABS: 250.0000 mg | ORAL_TABLET | Freq: Every day | ORAL | 0 refills | Status: DC

## 2016-11-01 NOTE — ED Provider Notes (Signed)
[facility] Provider Note   CSN: [id number] Arrival date & time: 11/01/16  0354     History   Chief Complaint Chief Complaint  Patient presents with  . Back Pain    HPI Kevin Hebert is a 31 y.o. male no sig PMH here with respiratory congestion, SOB.  He is here for new back pain with deep inspiration. He has sick contacts in his children at home. He took OTC equate without any relief.  He also has rhinorrhea.  He denies history of PE. He denies risk factors as well. There are no further complaints.     10 Systems reviewed and are negative for acute change except as noted in the HPI.  HPI  History reviewed. No pertinent past medical history.  There are no active problems to display for this patient.   Past Surgical History:  Procedure Laterality Date  . ANTERIOR CRUCIATE LIGAMENT REPAIR         Home Medications    Prior to Admission medications   Medication Sig Start Date End Date Taking? Authorizing Provider  azithromycin (ZITHROMAX) 250 MG tablet Take 1 tablet (250 mg total) by mouth daily. 11/01/16   Tomasita CrumbleAdeleke Mandee Pluta, MD  divalproex (DEPAKOTE ER) 500 MG 24 hr tablet Take 1 tablet (500 mg total) by mouth at bedtime. 12/14/14   Cleotis NipperSyed T Arfeen, MD  predniSONE (DELTASONE) 50 MG tablet Take 1 tablet daily with breakfast 08/09/15   Wynetta EmeryNicole Pisciotta, PA-C    Family History No family history on file.  Social History Social History  Substance Use Topics  . Smoking status: Never Smoker  . Smokeless tobacco: Never Used  . Alcohol use 0.0 oz/week     Comment: occassional     Allergies   Patient has no known allergies.   Review of Systems Review of Systems   Physical Exam Updated Vital Signs BP 127/72 (BP Location: Right Arm)   Pulse (!) 52   Temp 97.9 F (36.6 C) (Oral)   Resp 20   Ht 6\' 3"  (1.905 m)   Wt 210 lb (95.3 kg)   SpO2 100%   BMI 26.25 kg/m   Physical Exam  Constitutional: He is oriented to person, place, and time. Vital signs are  normal. He appears well-developed and well-nourished.  Non-toxic appearance. He does not appear ill. No distress.  HENT:  Head: Normocephalic and atraumatic.  Nose: Nose normal.  Mouth/Throat: Oropharynx is clear and moist. No oropharyngeal exudate.  Eyes: Conjunctivae and EOM are normal. Pupils are equal, round, and reactive to light. No scleral icterus.  Neck: Normal range of motion. Neck supple. No tracheal deviation, no edema, no erythema and normal range of motion present. No thyroid mass and no thyromegaly present.  Cardiovascular: Normal rate, regular rhythm, S1 normal, S2 normal, normal heart sounds, intact distal pulses and normal pulses.  Exam reveals no gallop and no friction rub.   No murmur heard. Pulmonary/Chest: Effort normal and breath sounds normal. No respiratory distress. He has no wheezes. He has no rhonchi. He has no rales.  Abdominal: Soft. Normal appearance and bowel sounds are normal. He exhibits no distension, no ascites and no mass. There is no hepatosplenomegaly. There is no tenderness. There is no rebound, no guarding and no CVA tenderness.  Musculoskeletal: Normal range of motion. He exhibits no edema or tenderness.  Lymphadenopathy:    He has no cervical adenopathy.  Neurological: He is alert and oriented to person, place, and time. He has normal strength. No  cranial nerve deficit or sensory deficit.  Skin: Skin is warm, dry and intact. No petechiae and no rash noted. He is not diaphoretic. No erythema. No pallor.  Nursing note and vitals reviewed.    ED Treatments / Results  Labs (all labs ordered are listed, but only abnormal results are displayed) Labs Reviewed - No data to display  EKG  EKG Interpretation None       Radiology Dg Chest 2 View  Result Date: 11/01/2016 CLINICAL DATA:  Productive cough and back pain. Cold symptoms for 1 month. EXAM: CHEST  2 VIEW COMPARISON:  Chest radiograph 08/09/2015 FINDINGS: The cardiomediastinal contours are  normal. The lungs are clear. Pulmonary vasculature is normal. No consolidation, pleural effusion, or pneumothorax. No acute osseous abnormalities are seen. IMPRESSION: Unremarkable radiographs of the chest. Electronically Signed   By: Rubye Oaks M.D.   On: 11/01/2016 04:48    Procedures Procedures (including critical care time)  Medications Ordered in ED Medications  ibuprofen (ADVIL,MOTRIN) tablet 600 mg (600 mg Oral Given 11/01/16 0458)  azithromycin (ZITHROMAX) tablet 500 mg (500 mg Oral Given 11/01/16 0458)     Initial Impression / Assessment and Plan / ED Course  I have reviewed the triage vital signs and the nursing notes.  Pertinent labs & imaging results that were available during my care of the patient were reviewed by me and considered in my medical decision making (see chart for details).     Patient history is consistent with pneumonia. CXR neg but will go ahead and treat. He was given ibuprofen and z pack. DC with the same. PCP fu advised within 3 dys. VS normal. Patient safe for DC.  Final Clinical Impressions(s) / ED Diagnoses   Final diagnoses:  Community acquired pneumonia of right lung, unspecified part of lung    New Prescriptions Discharge Medication List as of 11/01/2016  5:04 AM       Tomasita Crumble, MD 11/01/16 [phone]

## 2016-11-01 NOTE — ED Triage Notes (Signed)
Cold like sx x 1 month, coughing up yellow mucus, x 2 days,  Back pain w deep breathing x 6 hours

## 2019-01-09 DIAGNOSIS — F4325 Adjustment disorder with mixed disturbance of emotions and conduct: Secondary | ICD-10-CM | POA: Diagnosis not present

## 2019-01-09 DIAGNOSIS — E559 Vitamin D deficiency, unspecified: Secondary | ICD-10-CM | POA: Diagnosis not present

## 2019-01-09 DIAGNOSIS — J0100 Acute maxillary sinusitis, unspecified: Secondary | ICD-10-CM | POA: Diagnosis not present

## 2019-01-09 DIAGNOSIS — G44219 Episodic tension-type headache, not intractable: Secondary | ICD-10-CM | POA: Diagnosis not present

## 2019-02-23 DIAGNOSIS — E559 Vitamin D deficiency, unspecified: Secondary | ICD-10-CM | POA: Diagnosis not present

## 2019-02-23 DIAGNOSIS — Z136 Encounter for screening for cardiovascular disorders: Secondary | ICD-10-CM | POA: Diagnosis not present

## 2019-02-23 DIAGNOSIS — E785 Hyperlipidemia, unspecified: Secondary | ICD-10-CM | POA: Diagnosis not present

## 2019-02-23 DIAGNOSIS — Z0000 Encounter for general adult medical examination without abnormal findings: Secondary | ICD-10-CM | POA: Diagnosis not present

## 2019-02-23 DIAGNOSIS — Z0100 Encounter for examination of eyes and vision without abnormal findings: Secondary | ICD-10-CM | POA: Diagnosis not present

## 2019-02-23 DIAGNOSIS — F4325 Adjustment disorder with mixed disturbance of emotions and conduct: Secondary | ICD-10-CM | POA: Diagnosis not present

## 2019-02-23 DIAGNOSIS — Z131 Encounter for screening for diabetes mellitus: Secondary | ICD-10-CM | POA: Diagnosis not present

## 2019-02-23 DIAGNOSIS — G44219 Episodic tension-type headache, not intractable: Secondary | ICD-10-CM | POA: Diagnosis not present

## 2019-03-27 ENCOUNTER — Encounter (HOSPITAL_BASED_OUTPATIENT_CLINIC_OR_DEPARTMENT_OTHER): Payer: Self-pay | Admitting: *Deleted

## 2019-03-27 ENCOUNTER — Emergency Department (HOSPITAL_BASED_OUTPATIENT_CLINIC_OR_DEPARTMENT_OTHER)
Admission: EM | Admit: 2019-03-27 | Discharge: 2019-03-27 | Disposition: A | Discharge status: Home / Self Care | Payer: Federal, State, Local not specified - PPO | Attending: Emergency Medicine | Admitting: Emergency Medicine

## 2019-03-27 ENCOUNTER — Emergency Department (HOSPITAL_BASED_OUTPATIENT_CLINIC_OR_DEPARTMENT_OTHER): Payer: Federal, State, Local not specified - PPO

## 2019-03-27 ENCOUNTER — Other Ambulatory Visit: Payer: Self-pay

## 2019-03-27 DIAGNOSIS — R0789 Other chest pain: Secondary | ICD-10-CM | POA: Insufficient documentation

## 2019-03-27 DIAGNOSIS — R079 Chest pain, unspecified: Secondary | ICD-10-CM | POA: Diagnosis not present

## 2019-03-27 DIAGNOSIS — Z79899 Other long term (current) drug therapy: Secondary | ICD-10-CM | POA: Insufficient documentation

## 2019-03-27 HISTORY — DX: Anxiety disorder, unspecified: F41.9

## 2019-03-27 LAB — URINALYSIS, ROUTINE W REFLEX MICROSCOPIC
Bilirubin Urine: NEGATIVE
Glucose, UA: NEGATIVE mg/dL
Hgb urine dipstick: NEGATIVE
Ketones, ur: NEGATIVE mg/dL
Leukocytes,Ua: NEGATIVE
Nitrite: NEGATIVE
Protein, ur: NEGATIVE mg/dL
Specific Gravity, Urine: 1.02 (ref 1.005–1.030)
pH: 6 (ref 5.0–8.0)

## 2019-03-27 LAB — CBC WITH DIFFERENTIAL/PLATELET
Abs Immature Granulocytes: 0.01 10*3/uL (ref 0.00–0.07)
Basophils Absolute: 0 10*3/uL (ref 0.0–0.1)
Basophils Relative: 1 %
Eosinophils Absolute: 0.1 10*3/uL (ref 0.0–0.5)
Eosinophils Relative: 3 %
HCT: 45.3 % (ref 39.0–52.0)
Hemoglobin: 14.8 g/dL (ref 13.0–17.0)
Immature Granulocytes: 0 %
Lymphocytes Relative: 23 %
Lymphs Abs: 1 10*3/uL (ref 0.7–4.0)
MCH: 30.6 pg (ref 26.0–34.0)
MCHC: 32.7 g/dL (ref 30.0–36.0)
MCV: 93.8 fL (ref 80.0–100.0)
Monocytes Absolute: 0.3 10*3/uL (ref 0.1–1.0)
Monocytes Relative: 6 %
Neutro Abs: 3 10*3/uL (ref 1.7–7.7)
Neutrophils Relative %: 67 %
Platelets: 210 10*3/uL (ref 150–400)
RBC: 4.83 MIL/uL (ref 4.22–5.81)
RDW: 12 % (ref 11.5–15.5)
WBC: 4.5 10*3/uL (ref 4.0–10.5)
nRBC: 0 % (ref 0.0–0.2)

## 2019-03-27 LAB — BASIC METABOLIC PANEL
Anion gap: 9 (ref 5–15)
BUN: 13 mg/dL (ref 6–20)
CO2: 27 mmol/L (ref 22–32)
Calcium: 9.1 mg/dL (ref 8.9–10.3)
Chloride: 102 mmol/L (ref 98–111)
Creatinine, Ser: 1.08 mg/dL (ref 0.61–1.24)
GFR calc Af Amer: >60 mL/min (ref >60)
GFR calc non Af Amer: >60 mL/min (ref >60)
Glucose, Bld: 85 mg/dL (ref 70–99)
Potassium: 3.8 mmol/L (ref 3.5–5.1)
Sodium: 138 mmol/L (ref 135–145)

## 2019-03-27 LAB — TROPONIN I (HIGH SENSITIVITY)
Troponin I (High Sensitivity): 3 ng/L (ref <18)
Troponin I (High Sensitivity): 3 ng/L (ref <18)

## 2019-03-27 MED ORDER — ASPIRIN 325 MG PO TABS
325.0000 mg | ORAL_TABLET | Freq: Every day | ORAL | Status: DC
  Administered 2019-03-27: 325 mg via ORAL
  Filled 2019-03-27: qty 1

## 2019-03-27 MED ORDER — IBUPROFEN 600 MG PO TABS: 600.0000 mg | ORAL_TABLET | Freq: Four times a day (QID) | ORAL | 0 refills | Status: DC | PRN

## 2019-03-27 NOTE — Discharge Instructions (Signed)
You were seen in the ED today for chest pain. Your labwork and chest xray were very reassuring today. I recommend taking 600 mg Ibuprofen every 6-8 hours as needed for pain. If you have pain in between you can take Tylenol.   Please follow up with your PCP regarding your ED visit today.   Return to the ED for any worsening chest pain, shortness of breath, vomiting.

## 2019-03-27 NOTE — ED Provider Notes (Signed)
[facility] EMERGENCY DEPARTMENT Provider Note   CSN: [id number] Arrival date & time: 03/27/19  1211     History   Chief Complaint Chief Complaint  Patient presents with   Chest Pain    HPI Kevin GlimpseJohn D Hebert is a 33 y.o. male presents to the ED today complaining of sudden onset, constant, pressure/tightness, left-sided chest pain radiates into his back that started about 30 minutes prior to arrival. (Approximately 2 hours ago).  Reports that the chest pain is worse with certain movements.  The pain does not worsen with him sitting up versus supine.  No shortness of breath.  Patient initially reports that he was having a panic attack on arrival.  Reports that he has a history of anxiety but does not do anything for his symptoms.  Patient states that he typically does not have chest pain with his anxiety. He has not taken anything for his pain.  Has never had pain like this before.  No family history.  Patient is a non-smoker.  No recent prolonged travel or immobilizations.  No history of DVT/PE.  No hemoptysis.  Denies fever, chills, shortness of breath, cough, nausea, vomiting, diaphoresis, any other associated symptoms.   HPI: A 33 year old patient presents for evaluation of chest pain. Initial onset of pain was approximately 1-3 hours ago. The patient's chest pain is described as heaviness/pressure/tightness and is worse with exertion. The patient's chest pain is middle- or left-sided, is not well-localized, is not sharp and does not radiate to the arms/jaw/neck. The patient does not complain of nausea and denies diaphoresis. The patient has no history of stroke, has no history of peripheral artery disease, has not smoked in the past 90 days, denies any history of treated diabetes, has no relevant family history of coronary artery disease (first degree relative at less than age 34), is not hypertensive, has no history of hypercholesterolemia and does not have an elevated BMI (>=30).      Past Medical History:  Diagnosis Date   Anxiety     There are no active problems to display for this patient.   Past Surgical History:  Procedure Laterality Date   ANTERIOR CRUCIATE LIGAMENT REPAIR          Home Medications    Prior to Admission medications   Medication Sig Start Date End Date Taking? Authorizing Provider  azithromycin (ZITHROMAX) 250 MG tablet Take 1 tablet (250 mg total) by mouth daily. 11/01/16   Tomasita Crumbleni, Adeleke, MD  divalproex (DEPAKOTE ER) 500 MG 24 hr tablet Take 1 tablet (500 mg total) by mouth at bedtime. 12/14/14   Arfeen, Phillips GroutSyed T, MD  ibuprofen (ADVIL) 600 MG tablet Take 1 tablet (600 mg total) by mouth every 6 (six) hours as needed for moderate pain. 03/27/19   Tanda RockersVenter, Tierney Behl, PA-C  predniSONE (DELTASONE) 50 MG tablet Take 1 tablet daily with breakfast 08/09/15   Pisciotta, Joni ReiningNicole, PA-C    Family History No family history on file.  Social History Social History   Tobacco Use   Smoking status: Never Smoker   Smokeless tobacco: Never Used  Substance Use Topics   Alcohol use: Yes    Alcohol/week: 0.0 standard drinks    Comment: occassional   Drug use: Yes    Types: Marijuana     Allergies   Patient has no known allergies.   Review of Systems Review of Systems  Constitutional: Negative for chills and fever.  HENT: Negative for congestion.   Eyes: Negative for visual disturbance.  Respiratory: Negative for cough and shortness of breath.   Cardiovascular: Positive for chest pain.  Gastrointestinal: Negative for nausea and vomiting.  Genitourinary: Negative for difficulty urinating.  Musculoskeletal: Negative for myalgias.  Skin: Negative for rash.  Neurological: Negative for headaches.     Physical Exam Updated Vital Signs BP 116/89 (BP Location: Right Arm)    Pulse 76    Temp 98.7 F (37.1 C) (Oral)    Resp 18    Ht 6\' 3"  (1.905 m)    Wt 83.9 kg    SpO2 100%    BMI 23.12 kg/m   Physical Exam Vitals signs and nursing  note reviewed.  Constitutional:      Appearance: He is not ill-appearing.  HENT:     Head: Normocephalic and atraumatic.  Eyes:     Conjunctiva/sclera: Conjunctivae normal.  Neck:     Musculoskeletal: Neck supple.  Cardiovascular:     Rate and Rhythm: Normal rate and regular rhythm.  Pulmonary:     Effort: Pulmonary effort is normal.     Breath sounds: Normal breath sounds. No wheezing, rhonchi or rales.  Chest:     Chest wall: Tenderness present.  Abdominal:     Palpations: Abdomen is soft.     Tenderness: There is no abdominal tenderness. There is no right CVA tenderness or guarding.  Musculoskeletal:     Right lower leg: No edema.     Left lower leg: No edema.  Skin:    General: Skin is warm and dry.  Neurological:     Mental Status: He is alert.      ED Treatments / Results  Labs (all labs ordered are listed, but only abnormal results are displayed) Labs Reviewed  BASIC METABOLIC PANEL  CBC WITH DIFFERENTIAL/PLATELET  URINALYSIS, ROUTINE W REFLEX MICROSCOPIC  TROPONIN I (HIGH SENSITIVITY)  TROPONIN I (HIGH SENSITIVITY)    EKG EKG Interpretation  Date/Time:  Friday March 27 2019 13:55:55 EDT Ventricular Rate:  54 PR Interval:    QRS Duration: 80 QT Interval:  423 QTC Calculation: 401 R Axis:   89 Text Interpretation:  Sinus rhythm Benign early repolarization No significant change since last tracing today Confirmed by Alvira MondaySchlossman, Erin ([id number]) on 03/27/2019 9:24:35 PM   Radiology Dg Chest Port 1 View  Result Date: 03/27/2019 CLINICAL DATA:  Chest pain; SOB; pt believes he was having a panic attack; former smoker; no h/o heart problems; no h/o lung problems EXAM: PORTABLE CHEST 1 VIEW COMPARISON:  11/01/2016 FINDINGS: The heart size and mediastinal contours are within normal limits. Both lungs are clear. The visualized skeletal structures are unremarkable. IMPRESSION: No active disease. Electronically Signed   By: Norva PavlovElizabeth  Brown M.D.   On: 03/27/2019 14:08     Procedures Procedures (including critical care time)  Medications Ordered in ED Medications - No data to display   Initial Impression / Assessment and Plan / ED Course  I have reviewed the triage vital signs and the nursing notes.  Pertinent labs & imaging results that were available during my care of the patient were reviewed by me and considered in my medical decision making (see chart for details).    33 year old male who presents to the ED complaining of chest pain radiating into his back x2 hours.  Initially had presented complaining about a panic attack.  Reports history of anxiety but does not take any medicines.  He typically does not have chest pain with anxiety.  EKG was obtained but EDP provider was busy with  laceration/unable to fully evaluate EKG and his PA had not come in for shift.  No previous to compare.  This PA showed EKG to EDP who discussed case with cardiologist on call.  Questionable early re-pole.  Will obtain blood work to rule out ACS.  PERC negative.  Aspirin given.   Chest x-ray clear.  Initial troponin 3.  Peak troponin change.  No other abnormalities with blood work.  Patient heart score of 2.  I feel confident that this is not cardiac in nature.  Patient advised to take ibuprofen 600 mg every 6-8 hours as needed for pain.  Requesting prescription given he does not want to take 3 pills at 1 time.  Will write this for him today.  Patient advised to follow-up with his PCP.  Strict return precautions discussed.  Patient is in agreement with plan stable for discharge home. HEAR Score: 2    Final Clinical Impressions(s) / ED Diagnoses   Final diagnoses:  Nonspecific chest pain    ED Discharge Orders         Ordered    ibuprofen (ADVIL) 600 MG tablet  Every 6 hours PRN     03/27/19 1654           Eustaquio Maize, PA-C 03/27/19 2224    Gareth Morgan, MD 03/28/19 1106

## 2019-03-27 NOTE — ED Notes (Signed)
ED Provider at bedside. 

## 2019-03-27 NOTE — ED Triage Notes (Addendum)
Panic attack started 30 minutes ago. States when he has anxiety it makes his chest hurt.

## 2019-05-27 DIAGNOSIS — H16291 Other keratoconjunctivitis, right eye: Secondary | ICD-10-CM | POA: Diagnosis not present

## 2019-05-29 DIAGNOSIS — H16143 Punctate keratitis, bilateral: Secondary | ICD-10-CM | POA: Diagnosis not present

## 2019-05-29 DIAGNOSIS — H16291 Other keratoconjunctivitis, right eye: Secondary | ICD-10-CM | POA: Diagnosis not present

## 2019-07-26 ENCOUNTER — Emergency Department: Payer: Federal, State, Local not specified - PPO

## 2019-07-26 ENCOUNTER — Emergency Department
Admission: EM | Admit: 2019-07-26 | Discharge: 2019-07-26 | Disposition: A | Discharge status: Home / Self Care | Payer: Federal, State, Local not specified - PPO | Attending: Emergency Medicine | Admitting: Emergency Medicine

## 2019-07-26 ENCOUNTER — Other Ambulatory Visit: Payer: Self-pay

## 2019-07-26 DIAGNOSIS — S0990XA Unspecified injury of head, initial encounter: Secondary | ICD-10-CM | POA: Diagnosis not present

## 2019-07-26 DIAGNOSIS — S161XXA Strain of muscle, fascia and tendon at neck level, initial encounter: Secondary | ICD-10-CM

## 2019-07-26 DIAGNOSIS — Y939 Activity, unspecified: Secondary | ICD-10-CM | POA: Diagnosis not present

## 2019-07-26 DIAGNOSIS — Y929 Unspecified place or not applicable: Secondary | ICD-10-CM | POA: Diagnosis not present

## 2019-07-26 DIAGNOSIS — R519 Headache, unspecified: Secondary | ICD-10-CM | POA: Insufficient documentation

## 2019-07-26 DIAGNOSIS — S199XXA Unspecified injury of neck, initial encounter: Secondary | ICD-10-CM | POA: Diagnosis not present

## 2019-07-26 DIAGNOSIS — Y999 Unspecified external cause status: Secondary | ICD-10-CM | POA: Diagnosis not present

## 2019-07-26 MED ORDER — CYCLOBENZAPRINE HCL 10 MG PO TABS: 10.0000 mg | ORAL_TABLET | Freq: Three times a day (TID) | ORAL | 0 refills | Status: DC | PRN

## 2019-07-26 MED ORDER — CYCLOBENZAPRINE HCL 10 MG PO TABS
10.0000 mg | ORAL_TABLET | Freq: Once | ORAL | Status: AC
  Administered 2019-07-26: 10 mg via ORAL
  Filled 2019-07-26: qty 1

## 2019-07-26 MED ORDER — MELOXICAM 15 MG PO TABS: 15.0000 mg | ORAL_TABLET | Freq: Every day | ORAL | 2 refills | Status: AC

## 2019-07-26 MED ORDER — IBUPROFEN 600 MG PO TABS
600.0000 mg | ORAL_TABLET | Freq: Once | ORAL | Status: AC
  Administered 2019-07-26: 600 mg via ORAL
  Filled 2019-07-26: qty 1

## 2019-07-26 NOTE — ED Notes (Signed)
Pt asking for a snack, given graham crackers and peanut butter
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
## 2019-07-26 NOTE — Discharge Instructions (Signed)
Follow-up with your regular doctor if not better in 5 to 7 days.  Return emergency department of your choice if worsening.  Take the medication as prescribed.

## 2019-07-29 DIAGNOSIS — M542 Cervicalgia: Secondary | ICD-10-CM | POA: Diagnosis not present

## 2019-07-29 DIAGNOSIS — E559 Vitamin D deficiency, unspecified: Secondary | ICD-10-CM | POA: Diagnosis not present

## 2019-07-29 DIAGNOSIS — F4325 Adjustment disorder with mixed disturbance of emotions and conduct: Secondary | ICD-10-CM | POA: Diagnosis not present

## 2019-07-29 DIAGNOSIS — G44219 Episodic tension-type headache, not intractable: Secondary | ICD-10-CM | POA: Diagnosis not present

## 2019-08-10 DIAGNOSIS — G44219 Episodic tension-type headache, not intractable: Secondary | ICD-10-CM | POA: Diagnosis not present

## 2019-08-10 DIAGNOSIS — E559 Vitamin D deficiency, unspecified: Secondary | ICD-10-CM | POA: Diagnosis not present

## 2019-08-10 DIAGNOSIS — M542 Cervicalgia: Secondary | ICD-10-CM | POA: Diagnosis not present

## 2019-08-10 DIAGNOSIS — F4325 Adjustment disorder with mixed disturbance of emotions and conduct: Secondary | ICD-10-CM | POA: Diagnosis not present

## 2019-12-21 DIAGNOSIS — G44219 Episodic tension-type headache, not intractable: Secondary | ICD-10-CM | POA: Diagnosis not present

## 2019-12-21 DIAGNOSIS — E559 Vitamin D deficiency, unspecified: Secondary | ICD-10-CM | POA: Diagnosis not present

## 2019-12-21 DIAGNOSIS — M542 Cervicalgia: Secondary | ICD-10-CM | POA: Diagnosis not present

## 2019-12-21 DIAGNOSIS — Z131 Encounter for screening for diabetes mellitus: Secondary | ICD-10-CM | POA: Diagnosis not present

## 2019-12-21 DIAGNOSIS — Z125 Encounter for screening for malignant neoplasm of prostate: Secondary | ICD-10-CM | POA: Diagnosis not present

## 2019-12-21 DIAGNOSIS — F4325 Adjustment disorder with mixed disturbance of emotions and conduct: Secondary | ICD-10-CM | POA: Diagnosis not present

## 2019-12-21 DIAGNOSIS — Z1389 Encounter for screening for other disorder: Secondary | ICD-10-CM | POA: Diagnosis not present

## 2019-12-21 DIAGNOSIS — Z0110 Encounter for examination of ears and hearing without abnormal findings: Secondary | ICD-10-CM | POA: Diagnosis not present

## 2019-12-21 DIAGNOSIS — Z0000 Encounter for general adult medical examination without abnormal findings: Secondary | ICD-10-CM | POA: Diagnosis not present

## 2020-05-23 DIAGNOSIS — D7589 Other specified diseases of blood and blood-forming organs: Secondary | ICD-10-CM | POA: Diagnosis not present

## 2020-05-23 DIAGNOSIS — E785 Hyperlipidemia, unspecified: Secondary | ICD-10-CM | POA: Diagnosis not present

## 2020-05-23 DIAGNOSIS — Z1389 Encounter for screening for other disorder: Secondary | ICD-10-CM | POA: Diagnosis not present

## 2020-05-23 DIAGNOSIS — R030 Elevated blood-pressure reading, without diagnosis of hypertension: Secondary | ICD-10-CM | POA: Diagnosis not present

## 2020-05-23 DIAGNOSIS — E559 Vitamin D deficiency, unspecified: Secondary | ICD-10-CM | POA: Diagnosis not present
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
## 2021-01-16 DIAGNOSIS — Z0001 Encounter for general adult medical examination with abnormal findings: Secondary | ICD-10-CM | POA: Diagnosis not present

## 2021-01-16 DIAGNOSIS — E559 Vitamin D deficiency, unspecified: Secondary | ICD-10-CM | POA: Diagnosis not present

## 2021-01-16 DIAGNOSIS — Z13220 Encounter for screening for lipoid disorders: Secondary | ICD-10-CM | POA: Diagnosis not present

## 2021-01-16 DIAGNOSIS — E785 Hyperlipidemia, unspecified: Secondary | ICD-10-CM | POA: Diagnosis not present

## 2021-02-27 DIAGNOSIS — E785 Hyperlipidemia, unspecified: Secondary | ICD-10-CM | POA: Diagnosis not present

## 2021-07-10 DIAGNOSIS — B369 Superficial mycosis, unspecified: Secondary | ICD-10-CM | POA: Diagnosis not present

## 2021-07-10 DIAGNOSIS — M21619 Bunion of unspecified foot: Secondary | ICD-10-CM | POA: Diagnosis not present

## 2021-08-04 DIAGNOSIS — S99922A Unspecified injury of left foot, initial encounter: Secondary | ICD-10-CM | POA: Diagnosis not present

## 2021-08-04 DIAGNOSIS — M792 Neuralgia and neuritis, unspecified: Secondary | ICD-10-CM | POA: Diagnosis not present

## 2021-11-13 ENCOUNTER — Encounter (HOSPITAL_BASED_OUTPATIENT_CLINIC_OR_DEPARTMENT_OTHER): Payer: Self-pay | Admitting: Emergency Medicine

## 2021-11-13 ENCOUNTER — Emergency Department (HOSPITAL_BASED_OUTPATIENT_CLINIC_OR_DEPARTMENT_OTHER)
Admission: EM | Admit: 2021-11-13 | Discharge: 2021-11-13 | Disposition: A | Discharge status: Home / Self Care | Payer: Federal, State, Local not specified - PPO | Attending: Emergency Medicine | Admitting: Emergency Medicine

## 2021-11-13 ENCOUNTER — Other Ambulatory Visit: Payer: Self-pay

## 2021-11-13 DIAGNOSIS — E860 Dehydration: Secondary | ICD-10-CM | POA: Diagnosis not present

## 2021-11-13 DIAGNOSIS — M6282 Rhabdomyolysis: Secondary | ICD-10-CM | POA: Diagnosis not present

## 2021-11-13 DIAGNOSIS — R110 Nausea: Secondary | ICD-10-CM | POA: Diagnosis not present

## 2021-11-13 LAB — CBC WITH DIFFERENTIAL/PLATELET
Abs Immature Granulocytes: 0.01 10*3/uL (ref 0.00–0.07)
Basophils Absolute: 0.1 10*3/uL (ref 0.0–0.1)
Basophils Relative: 1 %
Eosinophils Absolute: 0.2 10*3/uL (ref 0.0–0.5)
Eosinophils Relative: 3 %
HCT: 43.6 % (ref 39.0–52.0)
Hemoglobin: 15.1 g/dL (ref 13.0–17.0)
Immature Granulocytes: 0 %
Lymphocytes Relative: 21 %
Lymphs Abs: 1.5 10*3/uL (ref 0.7–4.0)
MCH: 30.9 pg (ref 26.0–34.0)
MCHC: 34.6 g/dL (ref 30.0–36.0)
MCV: 89.3 fL (ref 80.0–100.0)
Monocytes Absolute: 0.5 10*3/uL (ref 0.1–1.0)
Monocytes Relative: 7 %
Neutro Abs: 4.9 10*3/uL (ref 1.7–7.7)
Neutrophils Relative %: 68 %
Platelets: 240 10*3/uL (ref 150–400)
RBC: 4.88 MIL/uL (ref 4.22–5.81)
RDW: 12.2 % (ref 11.5–15.5)
WBC: 7.1 10*3/uL (ref 4.0–10.5)
nRBC: 0 % (ref 0.0–0.2)

## 2021-11-13 LAB — URINALYSIS, ROUTINE W REFLEX MICROSCOPIC
Bilirubin Urine: NEGATIVE
Glucose, UA: NEGATIVE mg/dL
Ketones, ur: 15 mg/dL — AB
Leukocytes,Ua: NEGATIVE
Nitrite: NEGATIVE
Protein, ur: NEGATIVE mg/dL
Specific Gravity, Urine: >=1.03 (ref 1.005–1.030)
pH: 5.5 (ref 5.0–8.0)

## 2021-11-13 LAB — BASIC METABOLIC PANEL
Anion gap: 8 (ref 5–15)
Anion gap: 9 (ref 5–15)
BUN: 15 mg/dL (ref 6–20)
BUN: 14 mg/dL (ref 6–20)
CO2: 27 mmol/L (ref 22–32)
CO2: 24 mmol/L (ref 22–32)
Calcium: 9.5 mg/dL (ref 8.9–10.3)
Calcium: 9 mg/dL (ref 8.9–10.3)
Chloride: 104 mmol/L (ref 98–111)
Chloride: 106 mmol/L (ref 98–111)
Creatinine, Ser: 1.27 mg/dL — ABNORMAL HIGH (ref 0.61–1.24)
Creatinine, Ser: 1.17 mg/dL (ref 0.61–1.24)
GFR, Estimated: >60 mL/min (ref >60)
GFR, Estimated: >60 mL/min (ref >60)
Glucose, Bld: 80 mg/dL (ref 70–99)
Glucose, Bld: 92 mg/dL (ref 70–99)
Potassium: 3.9 mmol/L (ref 3.5–5.1)
Potassium: 3.7 mmol/L (ref 3.5–5.1)
Sodium: 139 mmol/L (ref 135–145)
Sodium: 139 mmol/L (ref 135–145)

## 2021-11-13 LAB — PHOSPHORUS: Phosphorus: 4.1 mg/dL (ref 2.5–4.6)

## 2021-11-13 LAB — CK
Total CK: 2314 U/L — ABNORMAL HIGH (ref 49–397)
Total CK: 2517 U/L — ABNORMAL HIGH (ref 49–397)

## 2021-11-13 LAB — URINALYSIS, MICROSCOPIC (REFLEX)

## 2021-11-13 MED ORDER — METOCLOPRAMIDE HCL 5 MG/ML IJ SOLN
10.0000 mg | Freq: Once | INTRAMUSCULAR | Status: AC
  Administered 2021-11-13: 10 mg via INTRAVENOUS
  Filled 2021-11-13: qty 2

## 2021-11-13 MED ORDER — LACTATED RINGERS IV BOLUS
1000.0000 mL | Freq: Once | INTRAVENOUS | Status: AC
  Administered 2021-11-13: 1000 mL via INTRAVENOUS

## 2021-11-13 MED ORDER — SODIUM CHLORIDE 0.9 % IV BOLUS: 1000.0000 mL | Freq: Once | INTRAVENOUS | Status: DC

## 2021-11-13 MED ORDER — KETOROLAC TROMETHAMINE 15 MG/ML IJ SOLN
15.0000 mg | Freq: Once | INTRAMUSCULAR | Status: AC
  Administered 2021-11-13: 15 mg via INTRAVENOUS
  Filled 2021-11-13: qty 1

## 2021-11-13 MED ORDER — SODIUM CHLORIDE 0.9 % IV BOLUS
1000.0000 mL | Freq: Once | INTRAVENOUS | Status: AC
  Administered 2021-11-13: 1000 mL via INTRAVENOUS

## 2021-11-13 NOTE — ED Triage Notes (Signed)
?  Patient comes in with dehydration and nausea that has been going on since 2200 last night.  Patient states he went to the gym around 2000 to have a hard workout and run 4 miles.  Patient states he recently started working back out again and hasn't went that hard lately.  Since patient came home he has had headache, ringing in his ears, abdominal pain, and nausea with no vomiting.  Patient able to keep down water at home but states he still feels sick.  Pain 2/10.  Took some excedrine migraine at 0100 this morning.   ?

## 2021-11-13 NOTE — ED Provider Notes (Signed)
? ?[facility] ?Provider Note: Lowella Dell, MD, FACEP ? ?CSN: [id number] ?MRN: [id number] ?ARRIVAL: 11/13/21 at 0254 ?ROOM: [facility] ? ? ?CHIEF COMPLAINT  ?Dehydration and Nausea ? ? ?HISTORY OF PRESENT ILLNESS  ?11/13/21 3:08 AM ?Kevin Hebert is a 36 y.o. male who went to the gym about 8 PM yesterday to have a hard workout run 4 miles.  This is harder than he usually works out.  Since he came home he has had a headache, ringing in his ears, abdominal pain, and nausea without vomiting.  He is able to keep water down at home but he still feels nauseated.  Rates his headache as a 2 out of 10.  He took Excedrin Migraine at 1 AM today.  He denies muscle pain.  He has not noticed if his urine is darker than usual.  He is having photophobia with this headache. ? ? ?Past Medical History:  ?Diagnosis Date  ? Anxiety   ? ? ?Past Surgical History:  ?Procedure Laterality Date  ? ANTERIOR CRUCIATE LIGAMENT REPAIR    ? ? ?History reviewed. No pertinent family history. ? ?Social History  ? ?Tobacco Use  ? Smoking status: Never  ? Smokeless tobacco: Never  ?Substance Use Topics  ? Alcohol use: Yes  ?  Alcohol/week: 0.0 standard drinks  ?  Comment: occassional  ? Drug use: Yes  ?  Types: Marijuana  ? ? ?Prior to Admission medications   ?Medication Sig Start Date End Date Taking? Authorizing Provider  ?cyclobenzaprine (FLEXERIL) 10 MG tablet Take 1 tablet (10 mg total) by mouth 3 (three) times daily as needed. 07/26/19   Faythe Ghee, PA-C  ?divalproex (DEPAKOTE ER) 500 MG 24 hr tablet Take 1 tablet (500 mg total) by mouth at bedtime. 12/14/14 07/26/19  Cleotis Nipper, MD  ? ? ?Allergies ?Patient has no known allergies. ? ? ?REVIEW OF SYSTEMS  ?Negative except as noted here or in the History of Present Illness. ? ? ?PHYSICAL EXAMINATION  ?Initial Vital Signs ?Blood pressure 138/88, pulse 71, temperature 98.2 ?F (36.8 ?C), temperature source Oral, resp. rate 18, height 6\' 3"  (1.905 m), weight 98 kg, SpO2 98  %. ? ?Examination ?General: Well-developed, well-nourished male in no acute distress; appearance consistent with age of record ?HENT: normocephalic; atraumatic ?Eyes: pupils equal, round and reactive to light; extraocular muscles intact; photophobia ?Neck: supple ?Heart: regular rate and rhythm ?Lungs: clear to auscultation bilaterally ?Abdomen: soft; nondistended; nontender; bowel sounds present ?Extremities: No deformity; full range of motion; pulses normal ?Neurologic: Awake, alert and oriented; motor function intact in all extremities and symmetric; no facial droop ?Skin: Warm and dry ?Psychiatric: Flat affect ? ? ?RESULTS  ?Summary of this visit's results, reviewed and interpreted by myself: ? ? EKG Interpretation ? ?Date/Time:    ?Ventricular Rate:    ?PR Interval:    ?QRS Duration:   ?QT Interval:    ?QTC Calculation:   ?R Axis:     ?Text Interpretation:   ?  ? ?  ? ?Laboratory Studies: ?Results for orders placed or performed during the hospital encounter of 11/13/21 (from the past 24 hour(s))  ?CK     Status: Abnormal  ? Collection Time: 11/13/21  3:20 AM  ?Result Value Ref Range  ? Total CK 2,314 (H) 49 - 397 U/L  ?CBC with Differential/Platelet     Status: None  ? Collection Time: 11/13/21  3:20 AM  ?Result Value Ref Range  ? WBC 7.1 4.0 - 10.5 K/uL  ?  RBC 4.88 4.22 - 5.81 MIL/uL  ? Hemoglobin 15.1 13.0 - 17.0 g/dL  ? HCT 43.6 39.0 - 52.0 %  ? MCV 89.3 80.0 - 100.0 fL  ? MCH 30.9 26.0 - 34.0 pg  ? MCHC 34.6 30.0 - 36.0 g/dL  ? RDW 12.2 11.5 - 15.5 %  ? Platelets 240 150 - 400 K/uL  ? nRBC 0.0 0.0 - 0.2 %  ? Neutrophils Relative % 68 %  ? Neutro Abs 4.9 1.7 - 7.7 K/uL  ? Lymphocytes Relative 21 %  ? Lymphs Abs 1.5 0.7 - 4.0 K/uL  ? Monocytes Relative 7 %  ? Monocytes Absolute 0.5 0.1 - 1.0 K/uL  ? Eosinophils Relative 3 %  ? Eosinophils Absolute 0.2 0.0 - 0.5 K/uL  ? Basophils Relative 1 %  ? Basophils Absolute 0.1 0.0 - 0.1 K/uL  ? Immature Granulocytes 0 %  ? Abs Immature Granulocytes 0.01 0.00 - 0.07  K/uL  ?Basic metabolic panel     Status: Abnormal  ? Collection Time: 11/13/21  3:20 AM  ?Result Value Ref Range  ? Sodium 139 135 - 145 mmol/L  ? Potassium 3.9 3.5 - 5.1 mmol/L  ? Chloride 104 98 - 111 mmol/L  ? CO2 27 22 - 32 mmol/L  ? Glucose, Bld 80 70 - 99 mg/dL  ? BUN 15 6 - 20 mg/dL  ? Creatinine, Ser 1.27 (H) 0.61 - 1.24 mg/dL  ? Calcium 9.5 8.9 - 10.3 mg/dL  ? GFR, Estimated >60 >60 mL/min  ? Anion gap 8 5 - 15  ?Urinalysis, Routine w reflex microscopic Urine, Clean Catch     Status: Abnormal  ? Collection Time: 11/13/21  3:20 AM  ?Result Value Ref Range  ? Color, Urine YELLOW YELLOW  ? APPearance HAZY (A) CLEAR  ? Specific Gravity, Urine >=1.030 1.005 - 1.030  ? pH 5.5 5.0 - 8.0  ? Glucose, UA NEGATIVE NEGATIVE mg/dL  ? Hgb urine dipstick TRACE (A) NEGATIVE  ? Bilirubin Urine NEGATIVE NEGATIVE  ? Ketones, ur 15 (A) NEGATIVE mg/dL  ? Protein, ur NEGATIVE NEGATIVE mg/dL  ? Nitrite NEGATIVE NEGATIVE  ? Leukocytes,Ua NEGATIVE NEGATIVE  ?Urinalysis, Microscopic (reflex)     Status: Abnormal  ? Collection Time: 11/13/21  3:20 AM  ?Result Value Ref Range  ? RBC / HPF 0-5 0 - 5 RBC/hpf  ? WBC, UA 0-5 0 - 5 WBC/hpf  ? Bacteria, UA RARE (A) NONE SEEN  ? Squamous Epithelial / LPF 0-5 0 - 5  ? Mucus PRESENT   ? Hyaline Casts, UA PRESENT   ?Phosphorus     Status: None  ? Collection Time: 11/13/21  3:20 AM  ?Result Value Ref Range  ? Phosphorus 4.1 2.5 - 4.6 mg/dL  ?CK     Status: Abnormal  ? Collection Time: 11/13/21  5:45 AM  ?Result Value Ref Range  ? Total CK 2,517 (H) 49 - 397 U/L  ?Basic metabolic panel     Status: None  ? Collection Time: 11/13/21  5:45 AM  ?Result Value Ref Range  ? Sodium 139 135 - 145 mmol/L  ? Potassium 3.7 3.5 - 5.1 mmol/L  ? Chloride 106 98 - 111 mmol/L  ? CO2 24 22 - 32 mmol/L  ? Glucose, Bld 92 70 - 99 mg/dL  ? BUN 14 6 - 20 mg/dL  ? Creatinine, Ser 1.17 0.61 - 1.24 mg/dL  ? Calcium 9.0 8.9 - 10.3 mg/dL  ? GFR, Estimated >60 >60 mL/min  ?  Anion gap 9 5 - 15  ? ?Imaging Studies: ?No  results found. ? ?ED COURSE and MDM  ?Nursing notes, initial and subsequent vitals signs, including pulse oximetry, reviewed and interpreted by myself. ? ?Vitals:  ? 11/13/21 0430 11/13/21 0545 11/13/21 0600 11/13/21 0630  ?BP: 132/83 (!) 146/84 130/88 128/86  ?Pulse: 68 66 65 65  ?Resp: 18 18 18 18   ?Temp:      ?TempSrc:      ?SpO2: 100% 100% 98% 98%  ?Weight:      ?Height:      ? ?Medications  ?sodium chloride 0.9 % bolus 1,000 mL (0 mLs Intravenous Stopped 11/13/21 0430)  ?metoCLOPramide (REGLAN) injection 10 mg (10 mg Intravenous Given 11/13/21 0334)  ?ketorolac (TORADOL) 15 MG/ML injection 15 mg (15 mg Intravenous Given 11/13/21 0334)  ?lactated ringers bolus 1,000 mL (0 mLs Intravenous Stopped 11/13/21 0534)  ?lactated ringers bolus 1,000 mL (0 mLs Intravenous Stopped 11/13/21 0646)  ? ?4:03 AM ?CK is elevated consistent with rhabdomyolysis.  We will continue IV hydration with LR.  ? ?4:35 AM ?McMahon score does not apply as CK is less than 5000.  ? ?6:07 AM ?Urine light amber-colored.  Third liter of IV fluids initiated.  Repeat CK pending. ? ?6:26 AM ?Repeat CK is elevated slightly.  Will recheck BMET to ensure kidney function is not worsening.  ? ?6:38 AM ?Kidney function has improved with hydration.  Creatinine is lower and there is no hyperkalemia.  I believe the patient is safe for discharge home as he has no serious comorbidities and this was exertional.  He was advised to return if symptoms worsen, especially darkening of the urine.  He states he feels significantly improved at this time. ? ? ?PROCEDURES  ?Procedures ?CRITICAL CARE ?Performed by: 01/13/22 Shealeigh Dunstan ?Total critical care time: 30 minutes ?Critical care time was exclusive of separately billable procedures and treating other patients. ?Critical care was necessary to treat or prevent imminent or life-threatening deterioration. ?Critical care was time spent personally by me on the following activities: development of treatment plan with patient and/or  surrogate as well as nursing, discussions with consultants, evaluation of patient's response to treatment, examination of patient, obtaining history from patient or surrogate, ordering and performing treatments and in

## 2022-05-04 DIAGNOSIS — E663 Overweight: Secondary | ICD-10-CM | POA: Diagnosis not present

## 2022-05-04 DIAGNOSIS — E559 Vitamin D deficiency, unspecified: Secondary | ICD-10-CM | POA: Diagnosis not present

## 2022-05-04 DIAGNOSIS — Z113 Encounter for screening for infections with a predominantly sexual mode of transmission: Secondary | ICD-10-CM | POA: Diagnosis not present

## 2022-05-04 DIAGNOSIS — E785 Hyperlipidemia, unspecified: Secondary | ICD-10-CM | POA: Diagnosis not present

## 2022-05-04 DIAGNOSIS — Z0000 Encounter for general adult medical examination without abnormal findings: Secondary | ICD-10-CM | POA: Diagnosis not present

## 2022-05-04 DIAGNOSIS — Z131 Encounter for screening for diabetes mellitus: Secondary | ICD-10-CM | POA: Diagnosis not present

## 2022-08-23 DIAGNOSIS — M25561 Pain in right knee: Secondary | ICD-10-CM | POA: Diagnosis not present

## 2022-08-27 DIAGNOSIS — E785 Hyperlipidemia, unspecified: Secondary | ICD-10-CM | POA: Diagnosis not present

## 2022-08-27 DIAGNOSIS — M25561 Pain in right knee: Secondary | ICD-10-CM | POA: Diagnosis not present

## 2022-08-27 DIAGNOSIS — E559 Vitamin D deficiency, unspecified: Secondary | ICD-10-CM | POA: Diagnosis not present

## 2022-08-27 DIAGNOSIS — E669 Obesity, unspecified: Secondary | ICD-10-CM | POA: Diagnosis not present

## 2022-08-27 DIAGNOSIS — R635 Abnormal weight gain: Secondary | ICD-10-CM | POA: Diagnosis not present

## 2022-08-29 DIAGNOSIS — M25561 Pain in right knee: Secondary | ICD-10-CM | POA: Diagnosis not present

## 2022-09-10 DIAGNOSIS — M6281 Muscle weakness (generalized): Secondary | ICD-10-CM | POA: Diagnosis not present

## 2022-09-10 DIAGNOSIS — S83411D Sprain of medial collateral ligament of right knee, subsequent encounter: Secondary | ICD-10-CM | POA: Diagnosis not present

## 2022-09-20 DIAGNOSIS — S83411D Sprain of medial collateral ligament of right knee, subsequent encounter: Secondary | ICD-10-CM | POA: Diagnosis not present

## 2022-09-20 DIAGNOSIS — M6281 Muscle weakness (generalized): Secondary | ICD-10-CM | POA: Diagnosis not present

## 2022-10-05 DIAGNOSIS — S83411D Sprain of medial collateral ligament of right knee, subsequent encounter: Secondary | ICD-10-CM | POA: Diagnosis not present

## 2022-10-05 DIAGNOSIS — M6281 Muscle weakness (generalized): Secondary | ICD-10-CM | POA: Diagnosis not present

## 2023-06-19 DIAGNOSIS — M25511 Pain in right shoulder: Secondary | ICD-10-CM | POA: Diagnosis not present

## 2023-09-24 DIAGNOSIS — J0100 Acute maxillary sinusitis, unspecified: Secondary | ICD-10-CM | POA: Diagnosis not present

## 2023-11-05 DIAGNOSIS — E785 Hyperlipidemia, unspecified: Secondary | ICD-10-CM | POA: Diagnosis not present

## 2023-11-05 DIAGNOSIS — Z131 Encounter for screening for diabetes mellitus: Secondary | ICD-10-CM | POA: Diagnosis not present

## 2023-11-05 DIAGNOSIS — Z0000 Encounter for general adult medical examination without abnormal findings: Secondary | ICD-10-CM | POA: Diagnosis not present

## 2023-11-05 DIAGNOSIS — Z113 Encounter for screening for infections with a predominantly sexual mode of transmission: Secondary | ICD-10-CM | POA: Diagnosis not present

## 2023-11-05 DIAGNOSIS — E559 Vitamin D deficiency, unspecified: Secondary | ICD-10-CM | POA: Diagnosis not present

## 2024-03-26 DIAGNOSIS — M25511 Pain in right shoulder: Secondary | ICD-10-CM | POA: Diagnosis not present

## 2024-03-26 DIAGNOSIS — E785 Hyperlipidemia, unspecified: Secondary | ICD-10-CM | POA: Diagnosis not present

## 2024-03-26 DIAGNOSIS — G8929 Other chronic pain: Secondary | ICD-10-CM | POA: Diagnosis not present

## 2024-03-26 DIAGNOSIS — E559 Vitamin D deficiency, unspecified: Secondary | ICD-10-CM | POA: Diagnosis not present

## 2024-04-21 DIAGNOSIS — M25511 Pain in right shoulder: Secondary | ICD-10-CM | POA: Diagnosis not present

## 2024-05-01 DIAGNOSIS — S46111D Strain of muscle, fascia and tendon of long head of biceps, right arm, subsequent encounter: Secondary | ICD-10-CM | POA: Diagnosis not present

## 2024-05-14 DIAGNOSIS — S46111D Strain of muscle, fascia and tendon of long head of biceps, right arm, subsequent encounter: Secondary | ICD-10-CM | POA: Diagnosis not present

## 2024-06-02 DIAGNOSIS — S46111D Strain of muscle, fascia and tendon of long head of biceps, right arm, subsequent encounter: Secondary | ICD-10-CM | POA: Diagnosis not present
